# Patient Record
Sex: Female | Born: 1945 | Race: White | Hispanic: No | Smoking: Never smoker
Health system: Southern US, Community
[De-identification: ages and names within clinical notes are randomized; demographics above are authoritative.]

## PROBLEM LIST (undated history)

## (undated) DIAGNOSIS — Z86010 Personal history of colonic polyps: Secondary | ICD-10-CM

## (undated) HISTORY — PX: CHOLECYSTECTOMY: SHX55

## (undated) HISTORY — DX: Personal history of colonic polyps: Z86.010

---

## 2001-03-20 ENCOUNTER — Other Ambulatory Visit: Admission: RE | Admit: 2001-03-20 | Discharge: 2001-03-20 | Payer: Self-pay | Admitting: Obstetrics and Gynecology

## 2002-07-24 ENCOUNTER — Other Ambulatory Visit: Admission: RE | Admit: 2002-07-24 | Discharge: 2002-07-24 | Payer: Self-pay | Admitting: Obstetrics and Gynecology

## 2003-12-29 ENCOUNTER — Other Ambulatory Visit: Admission: RE | Admit: 2003-12-29 | Discharge: 2003-12-29 | Payer: Self-pay | Admitting: Obstetrics and Gynecology

## 2004-07-13 ENCOUNTER — Ambulatory Visit: Payer: Self-pay | Admitting: Internal Medicine

## 2005-01-16 ENCOUNTER — Ambulatory Visit: Payer: Self-pay | Admitting: Internal Medicine

## 2005-04-12 ENCOUNTER — Ambulatory Visit: Payer: Self-pay | Admitting: Internal Medicine

## 2005-08-22 ENCOUNTER — Ambulatory Visit: Payer: Self-pay | Admitting: Internal Medicine

## 2008-06-19 ENCOUNTER — Ambulatory Visit: Payer: Self-pay | Admitting: Internal Medicine

## 2008-07-03 ENCOUNTER — Ambulatory Visit: Payer: Self-pay | Admitting: Internal Medicine

## 2008-07-16 ENCOUNTER — Ambulatory Visit: Payer: Self-pay | Admitting: Cardiology

## 2008-11-03 ENCOUNTER — Ambulatory Visit (HOSPITAL_COMMUNITY): Admission: RE | Admit: 2008-11-03 | Discharge: 2008-11-03 | Payer: Self-pay | Admitting: Obstetrics and Gynecology

## 2008-11-03 ENCOUNTER — Encounter (INDEPENDENT_AMBULATORY_CARE_PROVIDER_SITE_OTHER): Payer: Self-pay | Admitting: Obstetrics and Gynecology

## 2009-05-07 ENCOUNTER — Encounter (INDEPENDENT_AMBULATORY_CARE_PROVIDER_SITE_OTHER): Payer: Self-pay | Admitting: *Deleted

## 2009-07-13 ENCOUNTER — Encounter: Payer: Self-pay | Admitting: Cardiology

## 2009-08-10 DIAGNOSIS — E785 Hyperlipidemia, unspecified: Secondary | ICD-10-CM

## 2009-08-10 DIAGNOSIS — R87619 Unspecified abnormal cytological findings in specimens from cervix uteri: Secondary | ICD-10-CM

## 2009-08-10 DIAGNOSIS — Z87898 Personal history of other specified conditions: Secondary | ICD-10-CM

## 2009-08-10 DIAGNOSIS — Z8719 Personal history of other diseases of the digestive system: Secondary | ICD-10-CM

## 2009-08-23 ENCOUNTER — Ambulatory Visit: Payer: Self-pay | Admitting: Cardiology

## 2011-01-10 LAB — COMPREHENSIVE METABOLIC PANEL
AST: 30 U/L (ref 0–37)
Albumin: 4.1 g/dL (ref 3.5–5.2)
Alkaline Phosphatase: 82 U/L (ref 39–117)
Chloride: 103 mEq/L (ref 96–112)
GFR calc Af Amer: >60 mL/min (ref >60)
Potassium: 4.2 mEq/L (ref 3.5–5.1)
Total Bilirubin: 0.6 mg/dL (ref 0.3–1.2)

## 2011-01-10 LAB — CBC
Platelets: 305 10*3/uL (ref 150–400)
WBC: 5.8 10*3/uL (ref 4.0–10.5)

## 2011-02-07 NOTE — H&P (Signed)
NAMEDAMEKA, Shelly White NO.:  [id number]   MEDICAL RECORD NO.:  [id number]         PATIENT TYPE:  WAMB   LOCATION:                                FACILITY:  [facility]   PHYSICIAN:  Guy Sandifer. Henderson Cloud, M.D. DATE OF BIRTH:  1946-05-10   DATE OF ADMISSION:  11/03/2008  DATE OF DISCHARGE:                              HISTORY & PHYSICAL   CHIEF COMPLAINT:  Endometrial masses.   HISTORY OF PRESENT ILLNESS:  This patient is a 65 year old divorced  white female, G1, P1, who has a family history of ovarian cancer.  For  that reason, a CA-125 was obtained which was normal.  An ultrasound was  also done.  That ultrasound revealed the uterus measuring 7.0 x 2.9 x  4.6 cm.  There was a suggestion of an endometrial density.  Subsequent  sonohysterogram was consistent with an 11- and a 4-mm polypoid type  masses within the endometrial cavity.  Hysteroscopy with resectoscope,  dilatation and curettage has been reviewed.  All questions have been  answered.   PAST MEDICAL HISTORY:  1. History of abnormal Pap smear.  2. History of headache.  3. History of gallbladder disease.   PAST SURGICAL HISTORY:  1. Tonsillectomy.  2. Bartholin cyst drainage.  3. Cholecystectomy.  4. Carpal tunnel surgery.   MEDICATIONS:  None.   No known drug allergies.   SOCIAL HISTORY:  Denies tobacco, alcohol, or drug abuse.   FAMILY HISTORY:  Positive for peptic ulcer disease, anemia, gallbladder  disease, osteoporosis, diverticulosis, arthritis, diabetes, and cancer.   REVIEW OF SYSTEMS:  NEUROLOGIC:  Denies headache.  CARDIAC:  Denies  chest pain.  PULMONARY:  Denies shortness of breath.   PHYSICAL EXAMINATION:  VITAL SIGNS:  Height 5 feet 3 inches, weight 163  pounds, blood pressure 130/86.  LUNGS:  Clear to auscultation.  HEART:  Regular rate and rhythm.  ABDOMEN:  Soft, nontender without masses.  PELVIC:  Vulva, vagina, cervix without lesion.  Uterus normal size,  mobile, nontender.  Adnexa  nontender without masses.  EXTREMITIES:  Grossly within normal limits.  NEUROLOGIC:  Grossly within normal limits.   ASSESSMENT:  Endometrial masses.   PLAN:  Hysteroscopy with resectoscope, dilatation and curettage.      Guy Sandifer Henderson Cloud, M.D.  Electronically Signed     JET/MEDQ  D:  10/28/2008  T:  10/29/2008  Job:  [id number]

## 2011-02-07 NOTE — Assessment & Plan Note (Signed)
[facility]                            CARDIOLOGY OFFICE NOTE   SHANDRIA, CLINCH                       MRN:          [id number]  DATE:07/16/2008                            DOB:          12/17/1945    I was asked by Dr. Harold Hedge to consult on Shelly White with a  mixed hyperlipidemia.   HISTORY OF PRESENT ILLNESS:  She is 65 years of age divorced white  female who has had elevated cholesterol for sometime.  Her last values  had gone up to the highest level of 810 and were 250.  Total  cholesterol, HDL was 39, LDL 193, triglycerides 91, and total  cholesterol HDL ratio was 6.4.   She has no other conventional risk factors other than she is fairly  somewhat sedentary walking only sporadically and working in a yard and  she is over the age of 65.   She denies any symptoms of ischemia or anginal equivalents.   PAST MEDICAL HISTORY:  She has never smoked.  She does not drink  alcohol.  She has never used any recreational products.  She has been  mildly overweight for several years.   ALLERGIES:  She has had no dye allergies.  She has no known drug  allergies.   MEDICATIONS:  She is currently taking a multivitamin, vitamin D, vitamin  B12, and omega 3.   PAST SURGICAL HISTORY:  Otherwise, is significant for tonsillectomy in  1954, cholecystectomy in 1980, carpal tunnel in 1984 and 1987, and a  Bartholin cyst in 1971.   FAMILY HISTORY:  Negative for premature coronary artery disease.   SOCIAL HISTORY:  She is divorced.  She lives in [place].  She worked  in Marine scientist for 40 years.  She has 1 son.   REVIEW OF SYSTEMS:  Negative otherwise other than the HPI.   PHYSICAL EXAMINATION:  VITAL SIGNS:  Her blood pressure is 126/88, her  pulse is 83 and regular.  She is 5 feet 3 inches and weighs 154.  GENERAL:  She is very pleasant, but very anxious.  Alert and oriented  x3.  She is mildly overweight, in no acute distress.  HEENT:   Normal.  NECK:  Carotid upstrokes were equal bilateral without bruits.  No JVD.  Thyroid is not enlarged.  Trachea is midline.  LUNGS:  Clear to auscultation and percussion.  HEART:  Reveals a nondisplaced PMI.  Normal S1 and S2.  No murmur, rub,  or gallop.  ABDOMEN:  Soft, good bowel sounds.  No midline bruit.  EXTREMITIES:  Revealed no cyanosis, clubbing, or edema.  Pulses are  intact.  NEUROLOGIC:  Intact.   Electrocardiogram is normal.   ASSESSMENT AND PLAN:  I spent a long time talking to Ms. Bucholz about  her current risk of having a cardiovascular event in the next 10 years.  I would put it somewhere in the 5% range.   She really does not want to take the medication though she does need  some criteria particularly with her HDL and her LDL being so high for a  statin.  We talked about the benefits of walking or exercise 3 hours a week.  She  is noticed a 10-15% improvement in her lipids when she loses weight,  which I have also advised her to do.  At this point, we will postpone  pharmacological therapy.   PLAN:  1. Exercise 3 hours per week.  2. Try to lose 10% of body weight.  3. Continue omega-3 fish oil,  4. See him back in a year for accountability and followup.   She was advised if her LDL will continue to rise as she gets older  unless she substantially changes her diet and really loses weight.  At  that point, certainly a statin may become indicated as she gets older.     Thomas C. Daleen Squibb, MD, [facility]  Electronically Signed    TCW/MedQ  DD: 07/16/2008  DT: 07/16/2008  Job #: [id number]   cc:   Guy Sandifer. Henderson Cloud, M.D.

## 2011-02-07 NOTE — Op Note (Signed)
NAMEBOBBIE, Shelly White              ACCOUNT NO.:  [id number]   MEDICAL RECORD NO.:  [id number]          PATIENT TYPE:  AMB   LOCATION:  SDC                           FACILITY:  [facility]   PHYSICIAN:  Guy Sandifer. Henderson Cloud, M.D. DATE OF BIRTH:  02-16-1946   DATE OF PROCEDURE:  DATE OF DISCHARGE:                               OPERATIVE REPORT   PREOPERATIVE DIAGNOSIS:  Endometrial masses.   POSTOPERATIVE DIAGNOSIS:  Endometrial masses.   PROCEDURE:  Hysteroscopy with resection of endometrial masses,  dilatation curettage.   SURGEON:  Guy Sandifer. Henderson Cloud, M.D.   ANESTHESIA:  General with LMA.   SPECIMENS:  Endometrial resections with endometrial curettings, both to  pathology.   ESTIMATED BLOOD LOSS:  Drops, I's and O's of distending media 25 cc  deficit.   INDICATIONS AND CONSENT:  This patient is a 65 year old divorced white  female G1, P1 with a findings of endometrial masses.  Details are  dictated in history and physical.  Hysteroscopy, resectoscope,  dilatation and curettage has been discussed with the patient  preoperatively.  Potential risks and complications were discussed  preoperatively including not limited to infection, uterine perforation,  organ damage, bleeding requiring transfusion of blood products with HIV  and hepatitis acquisition, DVT, PE, pneumonia, recurrent polyps.  All  questions have been answered and consent is signed on the chart.   FINDINGS:  There are 2 polypoid-type structures both approximately 1 cm  arising from the upper posterior and upper anterior endometrial canals.  Fallopian tube __________ was identified bilaterally and no other  abnormal structures were noted.   PROCEDURE:  The patient is taken to the operating room where she is  identified, placed dorsal supine position, and general anesthesia was  induced via LMA.  She was then placed in dorsal lithotomy position where  she is prepped, bladder straight catheterized, and she is draped in  sterile fashion.  Bivalve speculum was placed in the vagina.  Anterior  cervical lip was injected with 1% Xylocaine and grasped with single-  tooth tenaculum.  Paracervical block was placed at 2, 4, 5, 7, 8, and 10  o'clock positions with approximately 20 cc of the same solution.  Cervix  was gently progressively dilated to 31 dilator.  The resectoscope with  single right-angle wire loop was placed.  The endocervical canal and  advanced under direct visualization using distending media.  The above  findings were noted.  The polypoid type structures are resected without  difficulty.  They are both removed and sent to Pathology.  Good  hemostasis was maintained.  Sharp curettage was done for scant  curettings.  Good hemostasis was noted.  All counts correct.  The  patient is awakened, taken to recovery room in stable condition.      Guy Sandifer Henderson Cloud, M.D.  Electronically Signed     JET/MEDQ  D:  11/03/2008  T:  11/03/2008  Job:  [id number]

## 2013-08-18 ENCOUNTER — Encounter: Payer: Self-pay | Admitting: Internal Medicine

## 2013-08-18 DIAGNOSIS — Z860100 Personal history of colon polyps, unspecified: Secondary | ICD-10-CM | POA: Insufficient documentation

## 2013-08-18 HISTORY — DX: Personal history of colonic polyps: Z86.010

## 2013-08-26 ENCOUNTER — Encounter: Payer: Self-pay | Admitting: Internal Medicine

## 2014-02-18 ENCOUNTER — Telehealth: Payer: Self-pay | Admitting: Internal Medicine

## 2014-02-18 NOTE — Telephone Encounter (Signed)
ok 

## 2014-02-18 NOTE — Telephone Encounter (Signed)
Pt would like to know if you will accept her as a pt. Pt says she saw you in the past, but it has been over 10 years. Pt does have medicare.

## 2014-02-23 ENCOUNTER — Encounter: Payer: Self-pay | Admitting: Internal Medicine

## 2014-02-23 ENCOUNTER — Ambulatory Visit (INDEPENDENT_AMBULATORY_CARE_PROVIDER_SITE_OTHER): Payer: Medicare Other | Admitting: Internal Medicine

## 2014-02-23 VITALS — BP 150/90 | HR 81 | Temp 98.4°F | Resp 20 | Ht 62.25 in | Wt 168.0 lb

## 2014-02-23 DIAGNOSIS — E010 Iodine-deficiency related diffuse (endemic) goiter: Secondary | ICD-10-CM

## 2014-02-23 DIAGNOSIS — Z86010 Personal history of colonic polyps: Secondary | ICD-10-CM

## 2014-02-23 DIAGNOSIS — E049 Nontoxic goiter, unspecified: Secondary | ICD-10-CM

## 2014-02-23 DIAGNOSIS — Z23 Encounter for immunization: Secondary | ICD-10-CM

## 2014-02-23 DIAGNOSIS — E785 Hyperlipidemia, unspecified: Secondary | ICD-10-CM

## 2014-02-23 NOTE — Progress Notes (Signed)
   Subjective:    Patient ID: Shelly White, female    DOB: 1946-01-07, 68 y.o.   MRN: [id number]  HPI  68 year old former patient who is seen today to reestablish.  She has history of mild dyslipidemia, as well as colonic polyps.  Her second colonoscopy was in 2009 and a 10 year interval was recommended. She takes no chronic prescription medications for a number of supplements and OTC antihistamines.  Past medical history:  Allergic rhinitis GERD Mild dyslipidemia History of colonic polyps  Surgical history:  Status post cholecystectomy Status post tonsillectomy  Family history father died age 29 Mother died 9 complications of essential thrombocytosis 2 brothers in good health.  One sister died of complications of ovarian cancer.  History of cardiomegaly  Social history nonsmoker Divorced One son and 2 grandchildren    Review of Systems  Constitutional: Negative for fever, appetite change, fatigue and unexpected weight change.  HENT: Negative for congestion, dental problem, ear pain, hearing loss, mouth sores, nosebleeds, sinus pressure, sore throat, tinnitus, trouble swallowing and voice change.   Eyes: Negative for photophobia, pain, redness and visual disturbance.  Respiratory: Negative for cough, chest tightness and shortness of breath.   Cardiovascular: Negative for chest pain, palpitations and leg swelling.  Gastrointestinal: Negative for nausea, vomiting, abdominal pain, diarrhea, constipation, blood in stool, abdominal distention and rectal pain.  Genitourinary: Negative for dysuria, urgency, frequency, hematuria, flank pain, vaginal bleeding, vaginal discharge, difficulty urinating, genital sores, vaginal pain, menstrual problem and pelvic pain.  Musculoskeletal: Negative for arthralgias, back pain and neck stiffness.  Skin: Negative for rash.  Neurological: Negative for dizziness, syncope, speech difficulty, weakness, light-headedness, numbness and headaches.    Hematological: Negative for adenopathy. Does not bruise/bleed easily.  Psychiatric/Behavioral: Negative for suicidal ideas, behavioral problems, self-injury, dysphoric mood and agitation. The patient is not nervous/anxious.        Objective:   Physical Exam  Constitutional: She is oriented to person, place, and time. She appears well-developed and well-nourished.  HENT:  Head: Normocephalic and atraumatic.  Right Ear: External ear normal.  Left Ear: External ear normal.  Mouth/Throat: Oropharynx is clear and moist.  Eyes: Conjunctivae and EOM are normal.  Neck: Normal range of motion. Neck supple. No JVD present. Thyromegaly present.  Enlarged nodular thyroid with a prominent left lobe  Cardiovascular: Normal rate, regular rhythm, normal heart sounds and intact distal pulses.   No murmur heard. Pulmonary/Chest: Effort normal and breath sounds normal. She has no wheezes. She has no rales.  Abdominal: Soft. Bowel sounds are normal. She exhibits no distension and no mass. There is no tenderness. There is no rebound and no guarding.  Musculoskeletal: Normal range of motion. She exhibits no edema and no tenderness.  Neurological: She is alert and oriented to person, place, and time. She has normal reflexes. No cranial nerve deficit. She exhibits normal muscle tone. Coordination normal.  Skin: Skin is warm and dry. No rash noted.  Psychiatric: She has a normal mood and affect. Her behavior is normal.          Assessment & Plan:   Mild dyslipidemia History of colonic polyps.  Followup colonoscopies 2019 Thyromegaly.  We'll check a thyroid ultrasound  Schedule CPX with updated lab

## 2014-02-23 NOTE — Addendum Note (Signed)
Addended by: Marian Sorrow on: 02/23/2014 05:39 PM   Modules accepted: Orders

## 2014-02-23 NOTE — Progress Notes (Signed)
Pre-visit discussion using our clinic review tool. No additional management support is needed unless otherwise documented below in the visit note.  

## 2014-02-23 NOTE — Patient Instructions (Signed)
Thyroid ultrasound as discussed  You need to lose weight.  Consider a lower calorie diet and regular exercise.    It is important that you exercise regularly, at least 20 minutes 3 to 4 times per week.  If you develop chest pain or shortness of breath seek  medical attention.  Limit your sodium (Salt) intake  Return in 6 months for follow-up

## 2014-03-02 ENCOUNTER — Ambulatory Visit
Admission: RE | Admit: 2014-03-02 | Discharge: 2014-03-02 | Disposition: A | Discharge status: Home / Self Care | Payer: Medicare Other | Source: Ambulatory Visit | Attending: Internal Medicine | Admitting: Internal Medicine

## 2014-03-02 ENCOUNTER — Other Ambulatory Visit: Payer: Self-pay | Admitting: Internal Medicine

## 2014-03-02 DIAGNOSIS — E010 Iodine-deficiency related diffuse (endemic) goiter: Secondary | ICD-10-CM

## 2014-03-02 DIAGNOSIS — E041 Nontoxic single thyroid nodule: Secondary | ICD-10-CM

## 2014-03-09 ENCOUNTER — Ambulatory Visit: Payer: Medicare Other | Admitting: Internal Medicine

## 2014-03-11 ENCOUNTER — Ambulatory Visit (INDEPENDENT_AMBULATORY_CARE_PROVIDER_SITE_OTHER): Payer: Medicare Other | Admitting: Endocrinology

## 2014-03-11 ENCOUNTER — Encounter: Payer: Self-pay | Admitting: Endocrinology

## 2014-03-11 VITALS — BP 128/76 | HR 84 | Temp 97.9°F | Resp 16 | Ht 62.25 in | Wt 163.0 lb

## 2014-03-11 DIAGNOSIS — E042 Nontoxic multinodular goiter: Secondary | ICD-10-CM | POA: Insufficient documentation

## 2014-03-11 NOTE — Progress Notes (Signed)
Patient ID: Shelly White, female   DOB: Apr 21, 1946, 68 y.o.   MRN: [id number]    Reason for Appointment: Thyroid nodule, new consultation  Referring physician:Kwiatkowski, Collier [place]     History of Present Illness:   The patient's thyroid nodule was first discovered in 6/15 She was seen by her PCP for general physical exam was found to have a goiter with more significant prominence on the left side She has not seen or felt a swelling in her neck recently Also has not been told to have a thyroid enlargement previously  She has had no difficulty with swallowing   Does not feel like she has any choking sensation in her neck or pressure in any position or when lying down.  No results found for this basename: freeT4,  tsh   She has had an ultrasound exam in 6/14 which showed the following  Multiple small areas of nodularity are present throughout the right lobe.  The only discretely marginated nodule is a predominantly solid and partially cystic nodule measuring approximately 1.6 x 1.0 x 1.1 cm. This nodule contains no calcifications. The second largest nodule in the right lobe is a cyst measuring 1.2 cm and having a benign appearance.  Left thyroid lobe: Marland Kitchen Dominant solid nodule in the mid to inferior aspect of the left lobe measures approximately 4.2 x 2.9 x 2.5 cm.       Medication List       This list is accurate as of: 03/11/14  9:34 PM.  Always use your most recent med list.               ADVIL 200 MG tablet  Generic drug:  ibuprofen  Take 200-400 mg by mouth every 6 (six) hours as needed.     ALAVERT ALLERGY/SINUS PO  Take 1 tablet by mouth as needed.     co-enzyme Q-10 30 MG capsule  Take 30 mg by mouth daily.     FIBER FORMULA Caps  Take 1-2 capsules by mouth 3 (three) times daily with meals as needed.     multivitamin tablet  Take 1 tablet by mouth daily.     OMEGA 3 PO  Take 1 tablet by mouth daily.     OVER THE COUNTER MEDICATION  Take 1 capsule by mouth 3  (three) times daily with meals as needed. Cholester Care     Resveratrol 100 MG Caps  Take 1 capsule by mouth daily.        Allergies: No Known Allergies  Past Medical History  Diagnosis Date  . Personal history of colonic adenoma 08/18/2013    Past Surgical History  Procedure Laterality Date  . Cholecystectomy      Family History  Problem Relation Age of Onset  . Thyroid disease Paternal Aunt     Social History:  reports that she has never smoked. She has never used smokeless tobacco. She reports that she does not drink alcohol or use illicit drugs.   Review of Systems:  There is no history of high blood pressure.             No history of Diabetes.      History of hyperlipidemia, taking only fish oil  She is seen regularly by her gynecologist         Examination:   BP 128/76  Pulse 84  Temp(Src) 97.9 F (36.6 C)  Resp 16  Ht 5' 2.25" (1.581 m)  Wt 163 lb (73.936 kg)  BMI 29.58 kg/m2  SpO2 96%  LMP 02/24/2003   General Appearance:  well-looking, mild generalized obesity present. She is somewhat anxious        Eyes: No abnormal prominence or eyelid swelling.          THYROID: Thyroid nodule felt on the left side especially on swallowing and measures about 2 cm.. Nodular right lobe also felt about twice normal Pemberton sign negative. No stridor present There is no lymphadenopathy .  Abdomen shows no hepatosplenomegaly Heart sounds normal   Reflexes  at biceps are slightly brisk. No tremor present  No pedal edema  Assessment/Plan:  Thyroid nodule: She has a dominant thyroid nodule on the left side as part of a multinodular goiter Discussed with the patient that since this is relatively larger and solid will need to evaluate further with a needle aspiration Discussed the technique of needle aspiration and submitted results Also explained to her that 95% of thyroid nodules are benign and she would need surgery only if she has malignant aspirate on the  needle aspiration or indeterminate cytology However we also need to check her thyroid functions to make sure she is not having any subclinical hyperthyroidism in which case a scan would be done first Brochure on thyroid nodules given and patient had no further questions   [facility] 03/11/2014

## 2014-03-12 LAB — TSH: TSH: 1.07 u[IU]/mL (ref 0.35–4.50)

## 2014-03-12 LAB — T4, FREE: Free T4: 0.73 ng/dL (ref 0.60–1.60)

## 2014-03-12 NOTE — Progress Notes (Signed)
Quick Note:  Please let patient know that the lab result is normal ______ 

## 2014-03-13 ENCOUNTER — Telehealth: Payer: Self-pay | Admitting: Endocrinology

## 2014-03-13 NOTE — Telephone Encounter (Signed)
Pt calling regarding message about results she could not understand the message in full pelase call back

## 2014-03-13 NOTE — Telephone Encounter (Signed)
I spoke with the patient and gave her detailed information on her lab results, she understood

## 2014-03-17 ENCOUNTER — Other Ambulatory Visit (HOSPITAL_COMMUNITY)
Admission: RE | Admit: 2014-03-17 | Discharge: 2014-03-17 | Disposition: A | Discharge status: Home / Self Care | Payer: Medicare Other | Source: Ambulatory Visit | Attending: Interventional Radiology | Admitting: Interventional Radiology

## 2014-03-17 ENCOUNTER — Ambulatory Visit
Admission: RE | Admit: 2014-03-17 | Discharge: 2014-03-17 | Disposition: A | Discharge status: Home / Self Care | Payer: Medicare Other | Source: Ambulatory Visit | Attending: Endocrinology | Admitting: Endocrinology

## 2014-03-17 DIAGNOSIS — E042 Nontoxic multinodular goiter: Secondary | ICD-10-CM | POA: Insufficient documentation

## 2014-03-20 NOTE — Progress Notes (Signed)
Quick Note:  Please let patient know that the biopsy showed benign cells, will see her in followup in 6 months ______

## 2015-02-15 ENCOUNTER — Telehealth: Payer: Self-pay

## 2015-02-15 NOTE — Telephone Encounter (Signed)
Called and spoke with pt about mammogram.  Pt states she usually gets her mammogram from Dr. Gaetano Net and she is going to call and set that up.  Advised pt to have a copy sent to Dr. Raliegh Ip.

## 2015-03-18 ENCOUNTER — Encounter: Payer: Self-pay | Admitting: Internal Medicine

## 2016-02-29 IMAGING — US US THYROID BIOPSY
1 series · 8 of 8 positions shown · non-contrast
Comparison: None.

CLINICAL DATA: Dominant left thyroid nodule

EXAM:
ULTRASOUND GUIDED NEEDLE ASPIRATE BIOPSY OF THE THYROID GLAND

[Series 1: us thyroid biopsy · 0.08mm/px · 8 acquisitions, 8 frames shown]
[im 1/8]
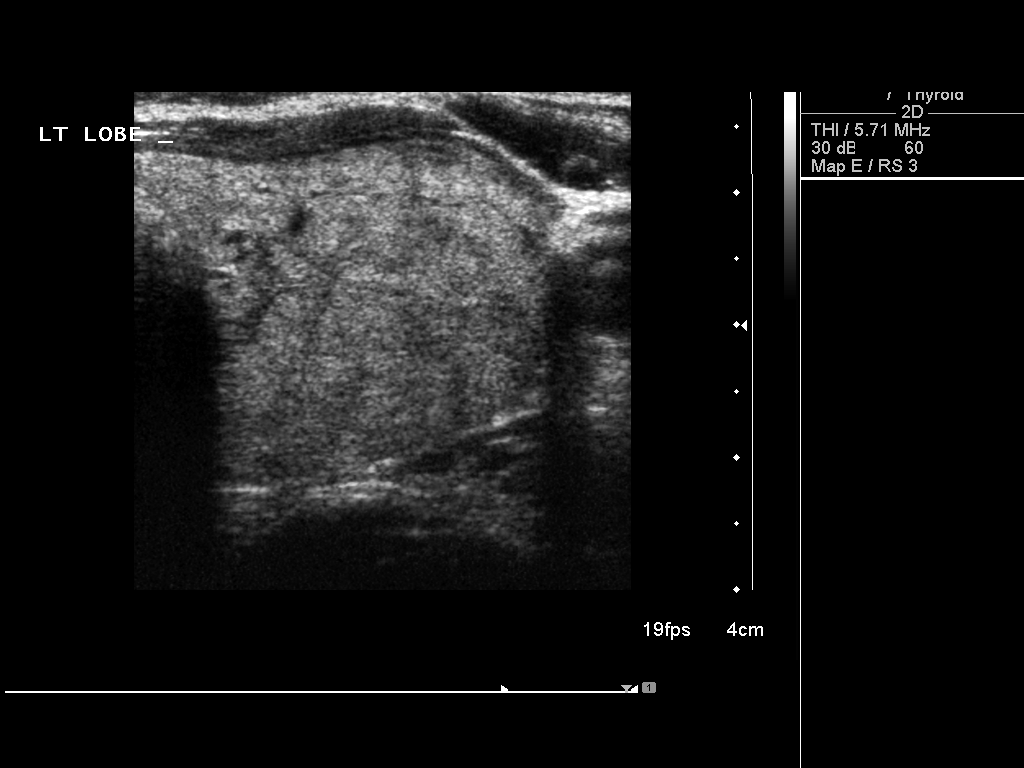
[im 2/8]
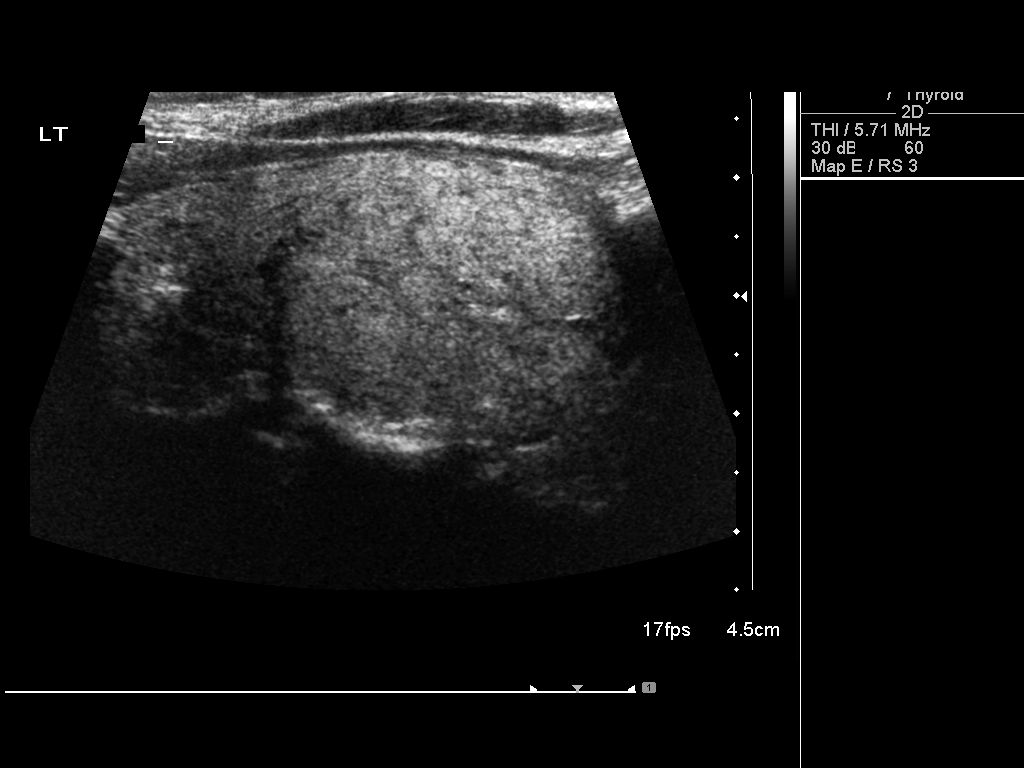
[im 3/8]
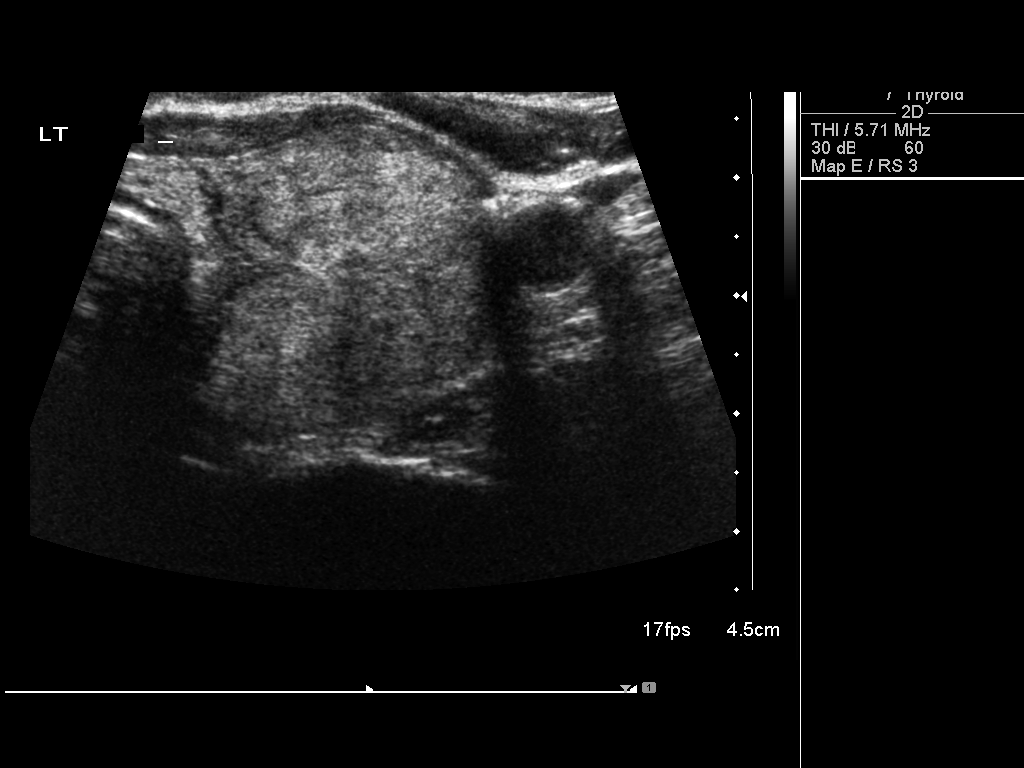
[im 4/8]
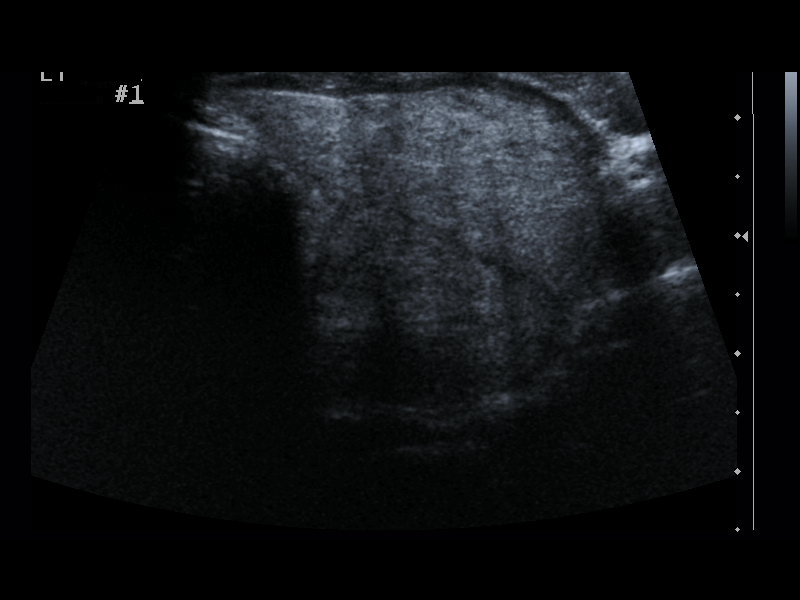
[im 5/8]
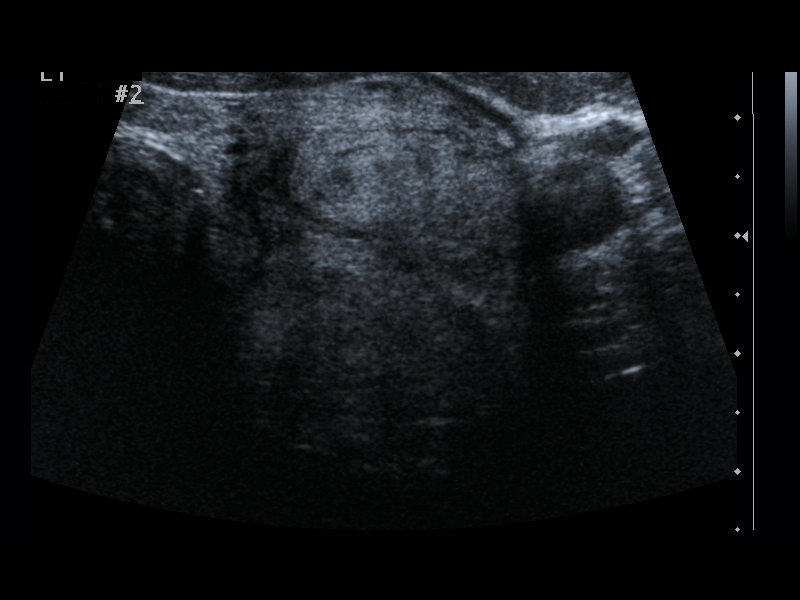
[im 6/8]
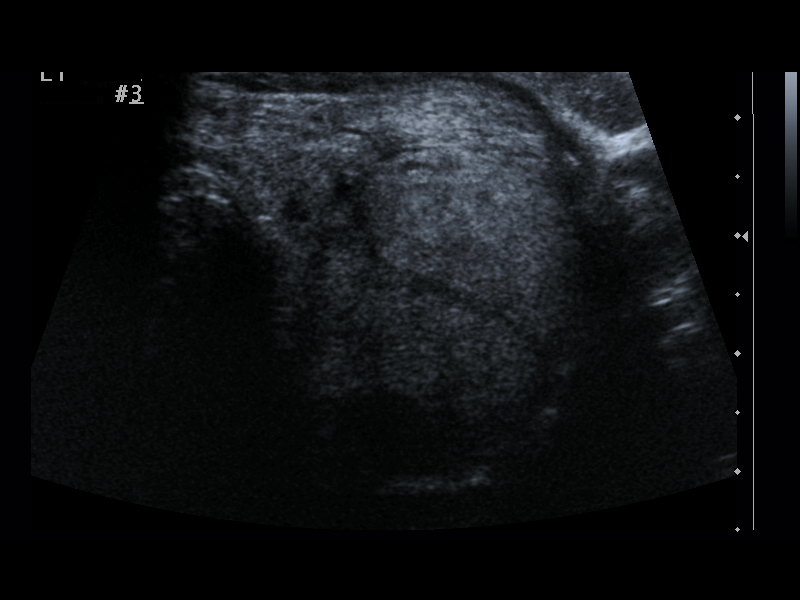
[im 7/8]
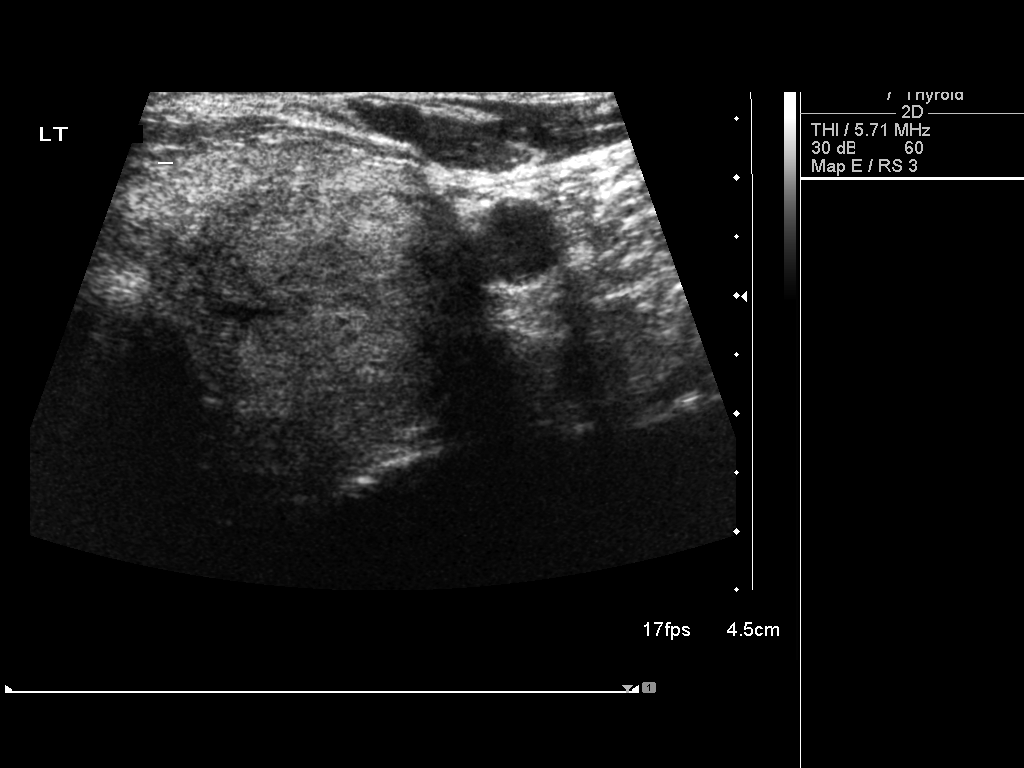
[im 8/8]
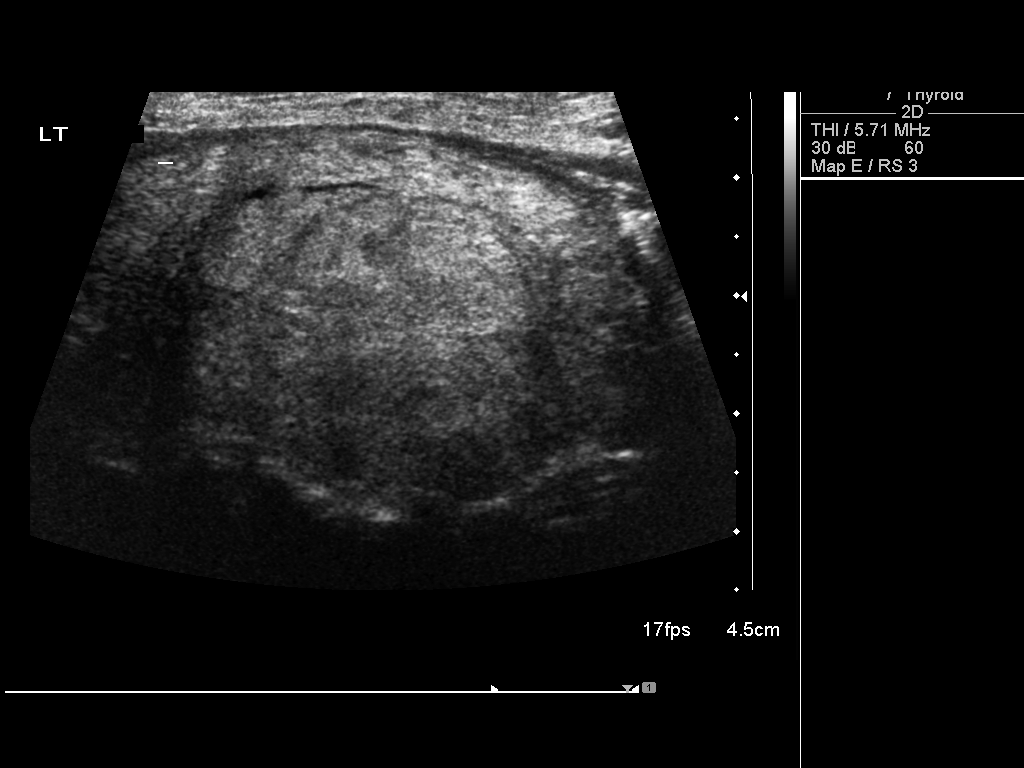

[8 of 8 positions shown; findings below may reference images not displayed]

PROCEDURE:
Thyroid biopsy was thoroughly discussed with the patient and
questions were answered. The benefits, risks, alternatives, and
complications were also discussed. The patient understands and
wishes to proceed with the procedure. Written consent was obtained.

Ultrasound was performed to localize and mark an adequate site for
the biopsy. The patient was then prepped and draped in a normal
sterile fashion. Local anesthesia was provided with 1% lidocaine.
Using direct ultrasound guidance, 3 passes were made using needles
into the nodule within the left lobe of the thyroid. Ultrasound was
used to confirm needle placements on all occasions. Specimens were
sent to Pathology for analysis.

Complications:  None.
FINDINGS: Images document needle placement in the dominant left thyroid
nodule.
IMPRESSION: Ultrasound guided needle aspirate biopsy performed of the left
thyroid nodule.

## 2016-07-03 LAB — HM MAMMOGRAPHY

## 2016-07-03 LAB — HM PAP SMEAR: HM PAP: NEGATIVE

## 2016-07-03 LAB — HM DEXA SCAN: HM DEXA SCAN: NORMAL

## 2016-08-08 ENCOUNTER — Encounter: Payer: Self-pay | Admitting: Internal Medicine

## 2017-02-20 ENCOUNTER — Encounter: Payer: Self-pay | Admitting: Internal Medicine

## 2017-02-20 ENCOUNTER — Ambulatory Visit (INDEPENDENT_AMBULATORY_CARE_PROVIDER_SITE_OTHER): Payer: Medicare HMO | Admitting: Internal Medicine

## 2017-02-20 VITALS — BP 148/82 | HR 78 | Temp 98.6°F | Ht 62.5 in | Wt 168.0 lb

## 2017-02-20 DIAGNOSIS — B079 Viral wart, unspecified: Secondary | ICD-10-CM | POA: Diagnosis not present

## 2017-02-20 DIAGNOSIS — Z86010 Personal history of colonic polyps: Secondary | ICD-10-CM | POA: Diagnosis not present

## 2017-02-20 DIAGNOSIS — E042 Nontoxic multinodular goiter: Secondary | ICD-10-CM

## 2017-02-20 NOTE — Progress Notes (Signed)
Subjective:    Patient ID: Shelly White, female    DOB: Sep 15, 1946, 71 y.o.   MRN: [id number]  HPI  71 year old patient who has not been seen in some time.  She presents with a chief complaint of a wart involving her left index finger.  She has been treated with cryotherapy by dermatology on a prior occasion She has a history of multinodular goiter.  She did have the OB/GYN evaluation in October of last year.  She has a history of mild dyslipidemia and also a history colonic polyps. Her last colonoscopy was 2009.  She had a prior adenomatous polyp in 2004 but a 10 year interval was recommended She has a history of vitamin D deficiency  Past Medical History:  Diagnosis Date  . Personal history of colonic adenoma 08/18/2013     Social History   Social History  . Marital status: Divorced    Spouse name: N/A  . Number of children: N/A  . Years of education: N/A   Occupational History  . Not on file.   Social History Main Topics  . Smoking status: Never Smoker  . Smokeless tobacco: Never Used  . Alcohol use No  . Drug use: No  . Sexual activity: Not on file   Other Topics Concern  . Not on file   Social History Narrative  . No narrative on file    Past Surgical History:  Procedure Laterality Date  . CHOLECYSTECTOMY      Family History  Problem Relation Age of Onset  . Thyroid disease Paternal Aunt     No Known Allergies  Current Outpatient Prescriptions on File Prior to Visit  Medication Sig Dispense Refill  . FIBER FORMULA CAPS Take 1-2 capsules by mouth 3 (three) times daily with meals as needed.    Marland Kitchen ibuprofen (ADVIL) 200 MG tablet Take 200-400 mg by mouth every 6 (six) hours as needed.    . Loratadine-Pseudoephedrine (ALAVERT ALLERGY/SINUS PO) Take 1 tablet by mouth as needed.    . Multiple Vitamin (MULTIVITAMIN) tablet Take 1 tablet by mouth daily.    . Omega-3 Fatty Acids (OMEGA 3 PO) Take 1 tablet by mouth daily.    Marland Kitchen OVER THE COUNTER MEDICATION Take  1 capsule by mouth 3 (three) times daily with meals as needed. Cholester Care    . Resveratrol 100 MG CAPS Take 1 capsule by mouth daily.     No current facility-administered medications on file prior to visit.     BP (!) 148/82 (BP Location: Left Arm, Patient Position: Sitting, Cuff Size: Normal)   Pulse 78   Temp 98.6 F (37 C) (Oral)   Ht 5' 2.5" (1.588 m)   Wt 168 lb (76.2 kg)   LMP 02/24/2003   SpO2 99%   BMI 30.24 kg/m     Review of Systems  Constitutional: Negative.   HENT: Negative for congestion, dental problem, hearing loss, rhinorrhea, sinus pressure, sore throat and tinnitus.   Eyes: Negative for pain, discharge and visual disturbance.  Respiratory: Negative for cough and shortness of breath.   Cardiovascular: Negative for chest pain, palpitations and leg swelling.  Gastrointestinal: Negative for abdominal distention, abdominal pain, blood in stool, constipation, diarrhea, nausea and vomiting.  Genitourinary: Negative for difficulty urinating, dysuria, flank pain, frequency, hematuria, pelvic pain, urgency, vaginal bleeding, vaginal discharge and vaginal pain.  Musculoskeletal: Negative for arthralgias, gait problem and joint swelling.  Skin: Negative for rash.       Wart, left index finger  Neurological: Negative for dizziness, syncope, speech difficulty, weakness, numbness and headaches.  Hematological: Negative for adenopathy.  Psychiatric/Behavioral: Negative for agitation, behavioral problems and dysphoric mood. The patient is not nervous/anxious.        Objective:   Physical Exam  Constitutional: She is oriented to person, place, and time. She appears well-developed and well-nourished.  HENT:  Head: Normocephalic.  Right Ear: External ear normal.  Left Ear: External ear normal.  Mouth/Throat: Oropharynx is clear and moist.  Eyes: Conjunctivae and EOM are normal. Pupils are equal, round, and reactive to light.  Neck: Normal range of motion. Neck supple.  Thyromegaly present.  Cardiovascular: Normal rate, regular rhythm, normal heart sounds and intact distal pulses.   Pulmonary/Chest: Effort normal and breath sounds normal.  Abdominal: Soft. Bowel sounds are normal. She exhibits no mass. There is no tenderness.  Musculoskeletal: Normal range of motion.  Lymphadenopathy:    She has no cervical adenopathy.  Neurological: She is alert and oriented to person, place, and time.  Skin: Skin is warm and dry. No rash noted.  6 mm typical wart involving the dorsal aspect of the left proximal index finger  Psychiatric: She has a normal mood and affect. Her behavior is normal.          Assessment & Plan:   Wart, left index finger.  Treated with cryotherapy Multinodular goiter History dyslipidemia  Patient will return at her convenience later in the year for an annual exam Colonoscopy 2019  Shelly White

## 2017-02-20 NOTE — Patient Instructions (Addendum)
WE NOW OFFER   [facility] FAST TRACK!!!  SAME DAY Appointments for ACUTE CARE  Such as: Sprains, Injuries, cuts, abrasions, rashes, muscle pain, joint pain, back pain Colds, flu, sore throats, headache, allergies, cough, fever  Ear pain, sinus and eye infections Abdominal pain, nausea, vomiting, diarrhea, upset stomach Animal/insect bites  3 Easy Ways to Schedule: Walk-In Scheduling Call in scheduling Mychart Sign-up: [email or web address]   Return in 4 months for your annual exam      Cryotherapy WHAT IS CRYOTHERAPY? Cryotherapy, or cold therapy, is a treatment that uses cold temperatures to treat an injury or medical condition. It includes using cold packs or ice packs to reduce pain and swelling. WHO SHOULD NOT USE CRYOTHERAPY? Cryotherapy is not safe for people who cannot tell you if they are in pain, such as small children and people who have dementia. Cryotherapy is also not safe for people with certain conditions, such as:  Raynaud phenomenon.  Cold hypersensitivity.  Numbness or loss of feeling in the area being iced. Cryotherapy may or may not be safe for people with certain other conditions. Do not use cryotherapy without your health care provider's approval if you have:  A heart condition.  High blood pressure.  Open or healing wounds.  An infection.  Rheumatoid arthritis.  Poor circulation.  Diabetes.  Certain skin conditions. HOW DO I USE CRYOTHERAPY? To use cryotherapy at home to reduce pain and swelling:  Place a towel between the cold source and your skin.  Apply the cold source for no more than 20 minutes at a time.  Check your skin after 5 minutes to make sure there are no signs of a poor response to cold or skin damage. Check for:  White spots on your skin. Your skin may look blotchy or mottled.  Skin that looks blue or pale.  Skin that feels waxy or hard.  Repeat these steps as many times each day as told by  your health care provider. HOW CAN I MAKE A COLD PACK? When using a cold pack at home to reduce pain and swelling, you can use:  A silica gel cold pack that has been left in the freezer. You can buy this online or in stores.  A plastic bag of frozen vegetables.  A sealable plastic bag that has been filled with crushed ice. Always wrap the pack in a dry or damp towel to avoid direct contact with your skin. WHEN SHOULD I CALL MY HEALTH CARE PROVIDER? Call your health care provider if:  You develop white spots on your skin. This may give your skin a blotchy or mottled look.  Your skin turns blue or pale.  Your skin becomes waxy or hard.  Your swelling gets worse. This information is not intended to replace advice given to you by your health care provider. Make sure you discuss any questions you have with your health care provider. Document Released: 05/08/2011 Document Revised: 02/17/2016 Document Reviewed: 05/26/2015 Elsevier Interactive Patient Education  2017 [employer].

## 2017-06-11 ENCOUNTER — Ambulatory Visit (INDEPENDENT_AMBULATORY_CARE_PROVIDER_SITE_OTHER): Payer: Medicare HMO | Admitting: Internal Medicine

## 2017-06-11 ENCOUNTER — Encounter: Payer: Self-pay | Admitting: Internal Medicine

## 2017-06-11 VITALS — BP 148/70 | HR 83 | Temp 98.3°F | Ht 62.5 in | Wt 167.2 lb

## 2017-06-11 DIAGNOSIS — B084 Enteroviral vesicular stomatitis with exanthem: Secondary | ICD-10-CM | POA: Diagnosis not present

## 2017-06-11 NOTE — Progress Notes (Signed)
Subjective:    Patient ID: Shelly White, female    DOB: [birth year], 71 y.o.   MRN: [id number]  HPI  71 year old patient who presents with a three-day history of rash involving her hands and feet.  One week ago.  She had a sore throat that has large resolved.  He complains of some weakness.  There is been no fever.  The rash involving the hands and feet is described as mildly painful and improving.  No pertinent exposure history  Past Medical History:  Diagnosis Date  . Personal history of colonic adenoma 08/18/2013     Social History   Social History  . Marital status: Divorced    Spouse name: N/A  . Number of children: N/A  . Years of education: N/A   Occupational History  . Not on file.   Social History Main Topics  . Smoking status: Never Smoker  . Smokeless tobacco: Never Used  . Alcohol use No  . Drug use: No  . Sexual activity: Not on file   Other Topics Concern  . Not on file   Social History Narrative  . No narrative on file    Past Surgical History:  Procedure Laterality Date  . CHOLECYSTECTOMY      Family History  Problem Relation Age of Onset  . Thyroid disease Paternal Aunt     No Known Allergies  Current Outpatient Prescriptions on File Prior to Visit  Medication Sig Dispense Refill  . Coenzyme Q10 200 MG capsule Take 200 mg by mouth daily.    Marland Kitchen FIBER FORMULA CAPS Take 1-2 capsules by mouth 3 (three) times daily with meals as needed.    Marland Kitchen ibuprofen (ADVIL) 200 MG tablet Take 200-400 mg by mouth every 6 (six) hours as needed.    . Loratadine-Pseudoephedrine (ALAVERT ALLERGY/SINUS PO) Take 1 tablet by mouth as needed.    . Multiple Vitamin (MULTIVITAMIN) tablet Take 1 tablet by mouth daily.    . Omega-3 Fatty Acids (OMEGA 3 PO) Take 1 tablet by mouth daily.    Marland Kitchen OVER THE COUNTER MEDICATION Take 1 capsule by mouth 3 (three) times daily with meals as needed. Cholester Care    . Resveratrol 100 MG CAPS Take 1 capsule by mouth daily.     No  current facility-administered medications on file prior to visit.     BP (!) 148/70 (BP Location: Left Arm, Patient Position: Sitting, Cuff Size: Normal)   Pulse 83   Temp 98.3 F (36.8 C) (Oral)   Ht 5' 2.5" (1.588 m)   Wt 167 lb 3.2 oz (75.8 kg)   LMP 02/24/2003   SpO2 96%   BMI 30.09 kg/m     Review of Systems  Constitutional: Positive for fatigue.  HENT: Positive for sore throat. Negative for congestion, dental problem, hearing loss, rhinorrhea, sinus pressure and tinnitus.   Eyes: Negative for pain, discharge and visual disturbance.  Respiratory: Negative for cough and shortness of breath.   Cardiovascular: Negative for chest pain, palpitations and leg swelling.  Gastrointestinal: Negative for abdominal distention, abdominal pain, blood in stool, constipation, diarrhea, nausea and vomiting.  Genitourinary: Negative for difficulty urinating, dysuria, flank pain, frequency, hematuria, pelvic pain, urgency, vaginal bleeding, vaginal discharge and vaginal pain.  Musculoskeletal: Negative for arthralgias, gait problem and joint swelling.  Skin: Positive for rash.  Neurological: Negative for dizziness, syncope, speech difficulty, weakness, numbness and headaches.  Hematological: Negative for adenopathy.  Psychiatric/Behavioral: Negative for agitation, behavioral problems and dysphoric mood. The patient is  not nervous/anxious.        Objective:   Physical Exam  Constitutional: She is oriented to person, place, and time. She appears well-developed and well-nourished.  HENT:  Head: Normocephalic.  Right Ear: External ear normal.  Left Ear: External ear normal.  Mouth/Throat: Oropharynx is clear and moist.  Oropharynx was clear  Eyes: Pupils are equal, round, and reactive to light. Conjunctivae and EOM are normal.  Neck: Normal range of motion. Neck supple. No thyromegaly present.  Cardiovascular: Normal rate, regular rhythm, normal heart sounds and intact distal pulses.     Pulmonary/Chest: Effort normal and breath sounds normal.  Abdominal: Soft. Bowel sounds are normal. She exhibits no mass. There is no tenderness.  Musculoskeletal: Normal range of motion.  Lymphadenopathy:    She has no cervical adenopathy.  Neurological: She is alert and oriented to person, place, and time.  Skin: Skin is warm and dry. No rash noted.  Scattered erythematous papular rash involving the hands and feet, most marked on the plantar surface of the feet and palmar side of the hands.  A few scattered blisters noted  Psychiatric: She has a normal mood and affect. Her behavior is normal.          Assessment & Plan:    Probable hand-foot-and-mouth disease.  Patient reassured.  Patient information Supplied Will treat symptomatically with Tylenol or Advil  [name]

## 2017-06-11 NOTE — Patient Instructions (Addendum)
Hand, Foot, and Mouth Disease, Adult Hand, foot, and mouth disease is a common viral illness. It happens mainly in children who are younger than 70 years old, but adolescents and adults may also get it. The illness often causes:  Sore throat.  Sores in the mouth.  Fever.  Rash on the hands and feet.  Usually, this condition is not serious. Most people get better within 1-2 weeks. What are the causes? This condition is usually caused by a group of viruses called enteroviruses. The disease can spread from person to person (is contagious). A person is most contagious during the first week of the illness. The infection spreads through direct contact with:  Nose discharge of an infected person.  Throat discharge of an infected person.  Stool (feces) of an infected person.  What are the signs or symptoms? Symptoms of this condition include:  Small sores in the mouth. These may cause pain.  A rash on the hands and feet, and sometimes on the buttocks. The rash may also occur on the arms, legs, or other areas of the body. The rash may look like small red bumps or sores and may have blisters.  Fever.  Body aches or headaches.  Irritability.  Decreased appetite.  How is this diagnosed? This condition can usually be diagnosed with a physical exam in which your health care provider will look at your rash and mouth sores. Tests are usually not needed. In some cases, a stool (feces) sample or a throat swab may be taken to check for the virus or for other infections. How is this treated? In most cases, no treatment is needed. People usually get better within 2 weeks without treatment. To help relieve pain or fever, your health care provider may recommend over-the-counter medicines such as ibuprofen or acetaminophen. To help relieve discomfort from mouth sores, your health care provider may recommend using:  Solutions that are rinsed in the mouth.  Pain-relieving gel that is applied to the  sores (topical gel).  Antacid medicine.  Follow these instructions at home: Managing pain and discomfort  Rinse your mouth with a salt-water mixture 3-4 times a day or as needed. To make a salt-water mixture, completely dissolve -1 tsp of salt in 1 cup of warm water. This can help to reduce pain from the mouth sores. Your health care provider may also recommend other rinse solutions to treat mouth sores.  To relieve discomfort when you are eating: ? Try combinations of foods to see what you can tolerate. Aim for a balanced diet. ? Eat soft foods. These may be easier to swallow. ? Avoid foods and drinks that are salty, spicy, or acidic. ? Avoid alcohol. ? Try cold food and drinks, such as water, milk, milkshakes, frozen ice pops, slushies, and sherbets. Low-calorie sport drinks are good choices for staying hydrated. General instructions  Return to your normal activities as told by your health care provider. Ask your health care provider what activities are safe for you.  Take or apply over-the-counter and prescription medicines only as told by your health care provider.  Wash your hands often with soap and water. If soap and water are not available, use hand sanitizer.  Stay away from work, schools, or other group settings during the first few days of the illness, or until your fever is gone.  Keep all follow-up visits as told by your health care provider. This is important. Contact a health care provider if:  Your symptoms get worse or do not   improve within 2 weeks.  You have pain that does not get better with medicine.  You feel very irritable.  You have trouble swallowing.  You develop sores or blisters on your lips or outside of your mouth.  You have a fever for more than 3 days. Get help right away if:  You develop signs of severe dehydration, such as: ? Decreased urination. This means urinating only very small amounts or urinating fewer than 3 times in a 24-hour  period. ? Urine that is very dark. ? Dry mouth, tongue, or lips. ? Decreased tears or sunken eyes. ? Dry skin. ? Rapid breathing. ? Decreased activity or being very sleepy. ? Pale skin. ? Fingertips taking longer than 2 seconds to turn pink after a gentle squeeze. ? Weight loss.  You have a severe headache.  You have a stiff neck.  You experience changes in your behavior.  You have chest pain.  You have trouble breathing. Summary  Hand, foot, and mouth disease is a common viral illness.  This disease can spread from person to person (is contagious).  The illness often causes a sore throat, sores in the mouth, fever, and a rash on the hands and feet.  Typically, no treatment is needed for this condition. People usually get better within 2 weeks without treatment.  Get help right away if you develop signs of severe dehydration.

## 2018-10-14 ENCOUNTER — Ambulatory Visit (INDEPENDENT_AMBULATORY_CARE_PROVIDER_SITE_OTHER): Payer: Medicare HMO | Admitting: Family Medicine

## 2018-10-14 ENCOUNTER — Ambulatory Visit (HOSPITAL_BASED_OUTPATIENT_CLINIC_OR_DEPARTMENT_OTHER)
Admission: RE | Admit: 2018-10-14 | Discharge: 2018-10-14 | Disposition: A | Discharge status: Home / Self Care | Payer: Medicare HMO | Source: Ambulatory Visit | Attending: Family Medicine | Admitting: Family Medicine

## 2018-10-14 ENCOUNTER — Encounter: Payer: Self-pay | Admitting: Family Medicine

## 2018-10-14 VITALS — BP 104/68 | HR 88 | Temp 98.3°F | Ht 62.0 in | Wt 168.0 lb

## 2018-10-14 DIAGNOSIS — S52352A Displaced comminuted fracture of shaft of radius, left arm, initial encounter for closed fracture: Secondary | ICD-10-CM | POA: Diagnosis not present

## 2018-10-14 DIAGNOSIS — M25532 Pain in left wrist: Secondary | ICD-10-CM

## 2018-10-14 DIAGNOSIS — S52502A Unspecified fracture of the lower end of left radius, initial encounter for closed fracture: Secondary | ICD-10-CM

## 2018-10-14 NOTE — Progress Notes (Signed)
Pre visit review using our clinic review tool, if applicable. No additional management support is needed unless otherwise documented below in the visit note. 

## 2018-10-14 NOTE — Patient Instructions (Addendum)
Ice/cold pack over area for 10-15 min twice daily.  OK to take Tylenol 1000 mg (2 extra strength tabs) or 975 mg (3 regular strength tabs) every 6 hours as needed.  If you do not hear anything about your referral in the next several days, call our office and ask for an update.  Let us know if you need anything.

## 2018-10-14 NOTE — Progress Notes (Signed)
Musculoskeletal Exam  Patient: Shelly White DOB: Jul 19, 1946  DOS: 10/14/2018  SUBJECTIVE:  Chief Complaint:   Chief Complaint  Patient presents with  . Fall    left hand pain and swelling    Shelly White is a 73 y.o.  female for evaluation and treatment of L hand pain.   Onset:  1 day ago. Fell. Location: L hand Character: sharp if she moves it a certain way Progression of issue:  has slightly improved Associated symptoms: bruising, swelling, decreased ROM Treatment: to date has been rest and ice.   Neurovascular symptoms: no  ROS: Musculoskeletal/Extremities: +wrist pain  Past Medical History:  Diagnosis Date  . Personal history of colonic adenoma 08/18/2013    Objective: VITAL SIGNS: BP 104/68 (BP Location: Left Arm, Patient Position: Sitting, Cuff Size: Normal)   Pulse 88   Temp 98.3 F (36.8 C) (Oral)   Ht 5\' 2"  (1.575 m)   Wt 168 lb (76.2 kg)   LMP 02/24/2003   SpO2 96%   BMI 30.73 kg/m  Constitutional: Well formed, well developed. No acute distress. Cardiovascular: Brisk cap refill Thorax & Lungs: No accessory muscle use Musculoskeletal: L wrist.   Normal active range of motion: no.   Normal passive range of motion: no Tenderness to palpation: yes over volar distal radius and volar distal ulna Deformity: pronounced edema Ecchymosis: yes Neurologic: Normal sensory function.  Psychiatric: Normal mood. Age appropriate judgment and insight. Alert & oriented x 3.    Assessment:  Closed fracture of distal end of left radius, unspecified fracture morphology, initial encounter - Plan: Ambulatory referral to Orthopedic Surgery  Left wrist pain - Plan: DG Wrist Complete Left  Plan: XR unofficially shows distal radius fx, refer ortho, place in splint. Ice, Tylenol, NSAIDs, wrist brace.  F/u prn. The patient voiced understanding and agreement to the plan.   [place], DO 10/14/18  2:39 PM

## 2018-10-16 ENCOUNTER — Ambulatory Visit (INDEPENDENT_AMBULATORY_CARE_PROVIDER_SITE_OTHER): Payer: Medicare HMO | Admitting: Orthopaedic Surgery

## 2018-10-16 DIAGNOSIS — S52502A Unspecified fracture of the lower end of left radius, initial encounter for closed fracture: Secondary | ICD-10-CM

## 2018-10-16 NOTE — Progress Notes (Signed)
   Office Visit Note   Patient: Shelly White           Date of Birth: 1946-02-11           MRN: [id number] Visit Date: 10/16/2018              Requested by: Shelda Pal, [address], [place] [address] PCP: Shelda Pal, DO   Assessment & Plan: Visit Diagnoses:  1. Closed fracture of distal end of left radius, unspecified fracture morphology, initial encounter     Plan: Impression is left distal radius fracture.  We will place the patient in a short arm cast.  She will elevate for swelling.  Continue with over-the-counter medications as needed for pain.  Follow-up with [place] in 1 week's time for repeat evaluation three-view x-rays of the left wrist.  Follow-Up Instructions: Return in about 1 week (around 10/23/2018).   Orders:  No orders of the defined types were placed in this encounter.  No orders of the defined types were placed in this encounter.     Procedures: No procedures performed   Clinical Data: No additional findings.   Subjective: Chief Complaint  Patient presents with  . Left Wrist - Fracture    HPI patient is a pleasant 73 year old right-hand-dominant female who presents to our clinic today following an injury to her left wrist.  She was mopping this past Sunday, 10/13/2018, when she slipped falling on an outstretched left hand into her mop bucket.  She was seen by her PCP where x-rays were obtained.  X-rays demonstrated a distal radius fracture.  She was placed in a removable splint.  She comes in today for further evaluation and treatment recommendation.  She is having pain to the entire left wrist.  She has moderate swelling as well.  She has not been elevating for swelling.  She has been alternating with ice and heat.  She has been taking Advil with great relief of symptoms.  She denies any radicular symptoms.  Review of Systems as detailed in HPI.  All others reviewed and are negative.   Objective: Vital  Signs: LMP 02/24/2003   Physical Exam well-developed well-nourished female in no acute distress.  Alert and oriented x3.  Ortho Exam examination of the left wrist reveals marked swelling into the hand.  Marked tenderness over the distal radius.  Limited range of motion secondary to pain.  She is neurovascularly intact distally.  Specialty Comments:  No specialty comments available.  Imaging: No new imaging   PMFS History: Patient Active Problem List   Diagnosis Date Noted  . Closed fracture of left distal radius 10/14/2018  . Multinodular goiter 03/11/2014  . Personal history of colonic adenoma 08/18/2013  . HYPERLIPIDEMIA-MIXED 08/10/2009  . PAP SMEAR, ABNORMAL 08/10/2009  . GALLBLADDER DISEASE, HX OF 08/10/2009  . HEADACHES, HX OF 08/10/2009   Past Medical History:  Diagnosis Date  . Personal history of colonic adenoma 08/18/2013    Family History  Problem Relation Age of Onset  . Thyroid disease Paternal Aunt     Past Surgical History:  Procedure Laterality Date  . CHOLECYSTECTOMY     Social History   Occupational History  . Not on file  Tobacco Use  . Smoking status: Never Smoker  . Smokeless tobacco: Never Used  Substance and Sexual Activity  . Alcohol use: No  . Drug use: No  . Sexual activity: Not on file

## 2018-10-17 ENCOUNTER — Encounter: Payer: Self-pay | Admitting: Internal Medicine

## 2018-10-23 ENCOUNTER — Ambulatory Visit (INDEPENDENT_AMBULATORY_CARE_PROVIDER_SITE_OTHER): Payer: Medicare HMO | Admitting: Orthopaedic Surgery

## 2018-10-23 ENCOUNTER — Encounter (INDEPENDENT_AMBULATORY_CARE_PROVIDER_SITE_OTHER): Payer: Self-pay | Admitting: Orthopaedic Surgery

## 2018-10-23 ENCOUNTER — Ambulatory Visit (INDEPENDENT_AMBULATORY_CARE_PROVIDER_SITE_OTHER): Payer: Medicare HMO

## 2018-10-23 DIAGNOSIS — S52532D Colles' fracture of left radius, subsequent encounter for closed fracture with routine healing: Secondary | ICD-10-CM

## 2018-10-23 NOTE — Progress Notes (Signed)
   Office Visit Note   Patient: Shelly White           Date of Birth: [birth year]           MRN: [id number] Visit Date: 10/23/2018              Requested by: Shelda Pal, [place], [place] [address] PCP: Shelda Pal, DO   Assessment & Plan: Visit Diagnoses:  1. Closed Colles' fracture of left radius with routine healing, subsequent encounter     Plan: Impression is stable alignment of distal radius fracture.  We will continue the short arm cast for another 2 weeks.  Reevaluation at that time with 2 view x-rays of the left wrist out of the cast.  If x-rays demonstrate healing we will begin hand therapy at that time.  Follow-Up Instructions: Return in about 2 weeks (around 11/06/2018).   Orders:  Orders Placed This Encounter  Procedures  . XR Wrist Complete Left   No orders of the defined types were placed in this encounter.     Procedures: No procedures performed   Clinical Data: No additional findings.   Subjective: Chief Complaint  Patient presents with  . Left Wrist - Pain    Shelly White is two-week status post nondisplaced left distal radius fracture.  She is doing better.  Swelling has improved.   Review of Systems   Objective: Vital Signs: LMP 02/24/2003   Physical Exam  Ortho Exam Left wrist exam shows a well fitting short arm cast.  Mild swelling of the fingers.  No neurovascular compromise. Specialty Comments:  No specialty comments available.  Imaging: Xr Wrist Complete Left  Result Date: 10/23/2018 Stable alignment of distal radius fracture.  No interval worsening or changes.    PMFS History: Patient Active Problem List   Diagnosis Date Noted  . Closed fracture of left distal radius 10/14/2018  . Multinodular goiter 03/11/2014  . Personal history of colonic adenoma 08/18/2013  . HYPERLIPIDEMIA-MIXED 08/10/2009  . PAP SMEAR, ABNORMAL 08/10/2009  . GALLBLADDER DISEASE, HX OF 08/10/2009    . HEADACHES, HX OF 08/10/2009   Past Medical History:  Diagnosis Date  . Personal history of colonic adenoma 08/18/2013    Family History  Problem Relation Age of Onset  . Thyroid disease Paternal Aunt     Past Surgical History:  Procedure Laterality Date  . CHOLECYSTECTOMY     Social History   Occupational History  . Not on file  Tobacco Use  . Smoking status: Never Smoker  . Smokeless tobacco: Never Used  Substance and Sexual Activity  . Alcohol use: No  . Drug use: No  . Sexual activity: Not on file

## 2018-11-07 ENCOUNTER — Ambulatory Visit (INDEPENDENT_AMBULATORY_CARE_PROVIDER_SITE_OTHER): Payer: Medicare HMO

## 2018-11-07 ENCOUNTER — Encounter (INDEPENDENT_AMBULATORY_CARE_PROVIDER_SITE_OTHER): Payer: Self-pay | Admitting: Orthopaedic Surgery

## 2018-11-07 ENCOUNTER — Ambulatory Visit (INDEPENDENT_AMBULATORY_CARE_PROVIDER_SITE_OTHER): Payer: Medicare HMO | Admitting: Orthopaedic Surgery

## 2018-11-07 DIAGNOSIS — S52532D Colles' fracture of left radius, subsequent encounter for closed fracture with routine healing: Secondary | ICD-10-CM

## 2018-11-07 NOTE — Progress Notes (Signed)
   Office Visit Note   Patient: Shelly White           Date of Birth: 11-Jan-1946           MRN: [id number] Visit Date: 11/07/2018              Requested by: Shelda Pal, [address] [place], [place] [address] PCP: Shelda Pal, DO   Assessment & Plan: Visit Diagnoses:  1. Closed Colles' fracture of left radius with routine healing, subsequent encounter     Plan: At this point she has demonstrated enough healing to start OT for hand and wrist rehab.  Referral was made today.  We discontinue the cast and placed in a removable wrist brace.  We will see her back in 6 weeks with two-view x-rays of the left wrist.  Follow-Up Instructions: Return in about 6 weeks (around 12/19/2018).   Orders:  Orders Placed This Encounter  Procedures  . XR Wrist 2 Views Left   No orders of the defined types were placed in this encounter.     Procedures: No procedures performed   Clinical Data: No additional findings.   Subjective: Chief Complaint  Patient presents with  . Left Wrist - Pain    [name] is 4 weeks status post nondisplaced distal radius fracture.  She is overall doing much better.  She has very little pain.  She has no real complaints today.   Review of Systems   Objective: Vital Signs: LMP 02/24/2003   Physical Exam  Ortho Exam Left wrist exam shows mild to moderate swelling.  No neurovascular compromise.  Range of motion is adequate for this time. Specialty Comments:  No specialty comments available.  Imaging: Xr Wrist 2 Views Left  Result Date: 11/07/2018 Stable alignment of distal radius fracture with evidence of progressive healing bony consolidation.    PMFS History: Patient Active Problem List   Diagnosis Date Noted  . Closed fracture of left distal radius 10/14/2018  . Multinodular goiter 03/11/2014  . Personal history of colonic adenoma 08/18/2013  . HYPERLIPIDEMIA-MIXED 08/10/2009  . PAP SMEAR, ABNORMAL  08/10/2009  . GALLBLADDER DISEASE, HX OF 08/10/2009  . HEADACHES, HX OF 08/10/2009   Past Medical History:  Diagnosis Date  . Personal history of colonic adenoma 08/18/2013    Family History  Problem Relation Age of Onset  . Thyroid disease Paternal Aunt     Past Surgical History:  Procedure Laterality Date  . CHOLECYSTECTOMY     Social History   Occupational History  . Not on file  Tobacco Use  . Smoking status: Never Smoker  . Smokeless tobacco: Never Used  Substance and Sexual Activity  . Alcohol use: No  . Drug use: No  . Sexual activity: Not on file

## 2018-11-20 ENCOUNTER — Telehealth (INDEPENDENT_AMBULATORY_CARE_PROVIDER_SITE_OTHER): Payer: Self-pay | Admitting: Orthopaedic Surgery

## 2018-11-20 DIAGNOSIS — S52532D Colles' fracture of left radius, subsequent encounter for closed fracture with routine healing: Secondary | ICD-10-CM

## 2018-11-20 NOTE — Telephone Encounter (Signed)
See message.

## 2018-11-20 NOTE — Telephone Encounter (Signed)
Wrist rehab s/p wrist fracture

## 2018-11-20 NOTE — Telephone Encounter (Signed)
[facility] [place] [facility]

## 2018-11-20 NOTE — Telephone Encounter (Signed)
What do you want to order for her for OT? Anything specific?

## 2018-11-20 NOTE — Telephone Encounter (Signed)
Pt called saying she was told to go to PT in [place] but they don't have a OT. So pt needs a note for physical therapy and for it to be approved through her insurance .

## 2018-11-21 NOTE — Addendum Note (Signed)
Addended by: Anselm Pancoast on: 11/21/2018 08:47 AM   Modules accepted: Orders

## 2018-11-21 NOTE — Telephone Encounter (Signed)
Patient called back stating that she is needing to speak with someone about her PT/OT.  She also stated that Dr. Erlinda Hong filled out the wrong form to be approved through her insurance.  Please advise pt.  Thank you.  CB#[phone].  She will be leaving her house around 11:00 a.m.

## 2018-11-21 NOTE — Telephone Encounter (Signed)
I talked to patient again and [facility] [place] [facility] is too far away.  I will reach out to [facility] PT there in [place].  Referral amended.

## 2018-11-21 NOTE — Telephone Encounter (Signed)
IC patient and LM that I will call [facility] PT in [place], ph [phone], at [address]  Need to schedule her for PT.

## 2018-11-22 NOTE — Telephone Encounter (Signed)
Correction, PT ph [phone].  IC LMVM for them to call me to discuss auth/scheduling.

## 2018-11-25 NOTE — Telephone Encounter (Signed)
Referral faxed

## 2018-11-25 NOTE — Telephone Encounter (Signed)
Referral sent to [place] at [place], at the [facility], ph [phone], fax [phone].

## 2018-12-09 DIAGNOSIS — M25642 Stiffness of left hand, not elsewhere classified: Secondary | ICD-10-CM | POA: Diagnosis not present

## 2018-12-09 DIAGNOSIS — S52532D Colles' fracture of left radius, subsequent encounter for closed fracture with routine healing: Secondary | ICD-10-CM | POA: Diagnosis not present

## 2018-12-09 DIAGNOSIS — R29898 Other symptoms and signs involving the musculoskeletal system: Secondary | ICD-10-CM | POA: Diagnosis not present

## 2018-12-19 ENCOUNTER — Ambulatory Visit (INDEPENDENT_AMBULATORY_CARE_PROVIDER_SITE_OTHER): Payer: Medicare HMO | Admitting: Orthopaedic Surgery

## 2019-01-21 ENCOUNTER — Telehealth: Payer: Self-pay | Admitting: *Deleted

## 2019-01-21 NOTE — Telephone Encounter (Signed)
Have you seen this report?

## 2019-01-21 NOTE — Telephone Encounter (Signed)
Called number to request order to be refaxed as PCP has not seen.

## 2019-01-21 NOTE — Telephone Encounter (Signed)
Copied from [place] [id number]. Topic: General - Inquiry >> Jan 20, 2019  5:01 PM Sheran Luz wrote: Reason for CRM: Deb with [facility] calling to follow up on a fax regarding patient's bone density scan that was sent to office on 4/17. Deb confirmed that she has the correct fax. If office never received, she is requesting a call back. Please advise.    Deb call back # [phone]

## 2019-01-21 NOTE — Telephone Encounter (Signed)
Unless it is in file now, I have not seen a bone density report from anyone in the last 10 days. Ty.

## 2019-02-07 NOTE — Telephone Encounter (Signed)
Called left detailed message do not see where we have received those results/not in media. Left message of both front/back fax numbers to refax to my atten//Dr. Nani Ravens

## 2019-02-07 NOTE — Telephone Encounter (Signed)
April checking in on status of fax.

## 2019-02-10 ENCOUNTER — Telehealth: Payer: Self-pay | Admitting: Family Medicine

## 2019-02-10 NOTE — Telephone Encounter (Signed)
PCP received paperwork from [facility] saying she is overdue for her bone Density. PCP to call the patient recommend Bone Density. I called the patient and she was already well aware of [facility] Medicare and this information.  They have been calling her and she did inform them that she takes care of that at her regular GYN yearly appt and once she can get out of the house (due to the virus) she will take care of this.  But she was very frustrated as they will not stop calling her. Have documented she declines PCP doing///will get done at her GYN. She was also very upset they had contacted her PCP regarding this.

## 2020-09-16 DIAGNOSIS — R059 Cough, unspecified: Secondary | ICD-10-CM | POA: Diagnosis not present

## 2020-09-16 DIAGNOSIS — Z20822 Contact with and (suspected) exposure to covid-19: Secondary | ICD-10-CM | POA: Diagnosis not present

## 2020-09-16 DIAGNOSIS — R0981 Nasal congestion: Secondary | ICD-10-CM | POA: Diagnosis not present

## 2020-09-16 DIAGNOSIS — J029 Acute pharyngitis, unspecified: Secondary | ICD-10-CM | POA: Diagnosis not present

## 2021-11-30 ENCOUNTER — Ambulatory Visit: Payer: Self-pay | Admitting: *Deleted

## 2021-11-30 DIAGNOSIS — I5022 Chronic systolic (congestive) heart failure: Secondary | ICD-10-CM | POA: Diagnosis not present

## 2021-11-30 DIAGNOSIS — R531 Weakness: Secondary | ICD-10-CM | POA: Diagnosis not present

## 2021-11-30 DIAGNOSIS — Z7901 Long term (current) use of anticoagulants: Secondary | ICD-10-CM | POA: Diagnosis not present

## 2021-11-30 DIAGNOSIS — I639 Cerebral infarction, unspecified: Secondary | ICD-10-CM | POA: Diagnosis not present

## 2021-11-30 DIAGNOSIS — I6523 Occlusion and stenosis of bilateral carotid arteries: Secondary | ICD-10-CM | POA: Diagnosis not present

## 2021-11-30 DIAGNOSIS — G8321 Monoplegia of upper limb affecting right dominant side: Secondary | ICD-10-CM | POA: Diagnosis not present

## 2021-11-30 DIAGNOSIS — R7303 Prediabetes: Secondary | ICD-10-CM | POA: Diagnosis not present

## 2021-11-30 DIAGNOSIS — R29703 NIHSS score 3: Secondary | ICD-10-CM | POA: Diagnosis not present

## 2021-11-30 DIAGNOSIS — Q2112 Patent foramen ovale: Secondary | ICD-10-CM | POA: Diagnosis not present

## 2021-11-30 DIAGNOSIS — E785 Hyperlipidemia, unspecified: Secondary | ICD-10-CM | POA: Diagnosis not present

## 2021-11-30 DIAGNOSIS — R270 Ataxia, unspecified: Secondary | ICD-10-CM | POA: Diagnosis not present

## 2021-11-30 DIAGNOSIS — I517 Cardiomegaly: Secondary | ICD-10-CM | POA: Diagnosis not present

## 2021-11-30 DIAGNOSIS — I5020 Unspecified systolic (congestive) heart failure: Secondary | ICD-10-CM | POA: Diagnosis not present

## 2021-11-30 DIAGNOSIS — I10 Essential (primary) hypertension: Secondary | ICD-10-CM | POA: Diagnosis not present

## 2021-11-30 DIAGNOSIS — I493 Ventricular premature depolarization: Secondary | ICD-10-CM | POA: Diagnosis not present

## 2021-11-30 DIAGNOSIS — I340 Nonrheumatic mitral (valve) insufficiency: Secondary | ICD-10-CM | POA: Diagnosis not present

## 2021-11-30 DIAGNOSIS — I110 Hypertensive heart disease with heart failure: Secondary | ICD-10-CM | POA: Diagnosis not present

## 2021-11-30 NOTE — Telephone Encounter (Signed)
?  Chief Complaint: right hand numb ?Symptoms: this AM no strength in right hand ?Frequency: yesterday ?Pertinent Negatives: Patient denies  ?Disposition: '[x]'$ ED /'[]'$ Urgent Care (no appt availability in office) / '[]'$ Appointment(In office/virtual)/ '[]'$  [place] [facility]/ '[]'$ Home Care/ '[]'$ Refused Recommended Disposition /'[]'$  Mobile Bus/ '[]'$  Follow-up with PCP ?Additional Notes: Unable to speak to patient, son calling. Advised ED, advised to alert PCP as well. States will follow disposition.   ? ? ? ? ?Reason for Disposition ? [1] Numbness (i.e., loss of sensation) of the face, arm / hand, or leg / foot on one side of the body AND [2] sudden onset AND [3] brief (now gone) ? ?Answer Assessment - Initial Assessment Questions ?1. SYMPTOM: "What is the main symptom you are concerned about?" (e.g., weakness, numbness) ?    Right hand numb ?2. ONSET: "When did this start?" (minutes, hours, days; while sleeping) ?    Yesterday at lunch ?3. LAST NORMAL: "When was the last time you (the patient) were normal (no symptoms)?" ?    Noon yesterday ?4. PATTERN "Does this come and go, or has it been constant since it started?"  "Is it present now?" ?    *No Answer* ?5. CARDIAC SYMPTOMS: "Have you had any of the following symptoms: chest pain, difficulty breathing, palpitations?" ?    *No Answer* ?6. NEUROLOGIC SYMPTOMS: "Have you had any of the following symptoms: headache, dizziness, vision loss, double vision, changes in speech, unsteady on your feet?" ?    *No Answer* ?7. OTHER SYMPTOMS: "Do you have any other symptoms?" ?    none ?8. PREGNANCY: "Is there any chance you are pregnant?" "When was your last menstrual period?" ?    *No Answer* ? ?Protocols used: Neurologic Deficit-A-AH ? ?

## 2021-12-01 DIAGNOSIS — I639 Cerebral infarction, unspecified: Secondary | ICD-10-CM | POA: Diagnosis not present

## 2021-12-01 DIAGNOSIS — I340 Nonrheumatic mitral (valve) insufficiency: Secondary | ICD-10-CM | POA: Diagnosis not present

## 2021-12-01 DIAGNOSIS — I517 Cardiomegaly: Secondary | ICD-10-CM | POA: Diagnosis not present

## 2021-12-04 DIAGNOSIS — I69398 Other sequelae of cerebral infarction: Secondary | ICD-10-CM | POA: Diagnosis not present

## 2021-12-04 DIAGNOSIS — Z8673 Personal history of transient ischemic attack (TIA), and cerebral infarction without residual deficits: Secondary | ICD-10-CM | POA: Diagnosis not present

## 2021-12-04 DIAGNOSIS — I517 Cardiomegaly: Secondary | ICD-10-CM | POA: Diagnosis not present

## 2021-12-04 DIAGNOSIS — I999 Unspecified disorder of circulatory system: Secondary | ICD-10-CM | POA: Diagnosis not present

## 2021-12-04 DIAGNOSIS — Z79899 Other long term (current) drug therapy: Secondary | ICD-10-CM | POA: Diagnosis not present

## 2021-12-04 DIAGNOSIS — R202 Paresthesia of skin: Secondary | ICD-10-CM | POA: Diagnosis not present

## 2021-12-04 DIAGNOSIS — I639 Cerebral infarction, unspecified: Secondary | ICD-10-CM | POA: Diagnosis not present

## 2021-12-04 DIAGNOSIS — R2990 Unspecified symptoms and signs involving the nervous system: Secondary | ICD-10-CM | POA: Diagnosis not present

## 2021-12-04 DIAGNOSIS — I493 Ventricular premature depolarization: Secondary | ICD-10-CM | POA: Diagnosis not present

## 2021-12-04 DIAGNOSIS — R7303 Prediabetes: Secondary | ICD-10-CM | POA: Diagnosis not present

## 2021-12-04 DIAGNOSIS — E785 Hyperlipidemia, unspecified: Secondary | ICD-10-CM | POA: Diagnosis not present

## 2021-12-04 DIAGNOSIS — R29818 Other symptoms and signs involving the nervous system: Secondary | ICD-10-CM | POA: Diagnosis not present

## 2021-12-05 DIAGNOSIS — Z8673 Personal history of transient ischemic attack (TIA), and cerebral infarction without residual deficits: Secondary | ICD-10-CM | POA: Diagnosis not present

## 2021-12-05 DIAGNOSIS — R2990 Unspecified symptoms and signs involving the nervous system: Secondary | ICD-10-CM | POA: Diagnosis not present

## 2021-12-05 DIAGNOSIS — I639 Cerebral infarction, unspecified: Secondary | ICD-10-CM | POA: Diagnosis not present

## 2021-12-05 DIAGNOSIS — I493 Ventricular premature depolarization: Secondary | ICD-10-CM | POA: Diagnosis not present

## 2021-12-05 DIAGNOSIS — E785 Hyperlipidemia, unspecified: Secondary | ICD-10-CM | POA: Diagnosis not present

## 2021-12-05 DIAGNOSIS — R7303 Prediabetes: Secondary | ICD-10-CM | POA: Diagnosis not present

## 2021-12-13 DIAGNOSIS — R7303 Prediabetes: Secondary | ICD-10-CM | POA: Diagnosis not present

## 2021-12-13 DIAGNOSIS — I639 Cerebral infarction, unspecified: Secondary | ICD-10-CM | POA: Diagnosis not present

## 2021-12-13 DIAGNOSIS — I110 Hypertensive heart disease with heart failure: Secondary | ICD-10-CM | POA: Diagnosis not present

## 2021-12-13 DIAGNOSIS — I5022 Chronic systolic (congestive) heart failure: Secondary | ICD-10-CM | POA: Diagnosis not present

## 2021-12-13 DIAGNOSIS — E7849 Other hyperlipidemia: Secondary | ICD-10-CM | POA: Diagnosis not present

## 2021-12-21 DIAGNOSIS — R29898 Other symptoms and signs involving the musculoskeletal system: Secondary | ICD-10-CM | POA: Diagnosis not present

## 2021-12-21 DIAGNOSIS — Z789 Other specified health status: Secondary | ICD-10-CM | POA: Diagnosis not present

## 2021-12-21 DIAGNOSIS — R2990 Unspecified symptoms and signs involving the nervous system: Secondary | ICD-10-CM | POA: Diagnosis not present

## 2021-12-21 DIAGNOSIS — Z7409 Other reduced mobility: Secondary | ICD-10-CM | POA: Diagnosis not present

## 2021-12-21 DIAGNOSIS — I639 Cerebral infarction, unspecified: Secondary | ICD-10-CM | POA: Diagnosis not present

## 2021-12-22 DIAGNOSIS — I5189 Other ill-defined heart diseases: Secondary | ICD-10-CM | POA: Diagnosis not present

## 2022-01-02 DIAGNOSIS — I639 Cerebral infarction, unspecified: Secondary | ICD-10-CM | POA: Diagnosis not present

## 2022-01-02 DIAGNOSIS — I5189 Other ill-defined heart diseases: Secondary | ICD-10-CM | POA: Diagnosis not present

## 2022-01-02 DIAGNOSIS — I5022 Chronic systolic (congestive) heart failure: Secondary | ICD-10-CM | POA: Diagnosis not present

## 2022-01-23 DIAGNOSIS — I639 Cerebral infarction, unspecified: Secondary | ICD-10-CM | POA: Diagnosis not present

## 2022-01-25 DIAGNOSIS — I10 Essential (primary) hypertension: Secondary | ICD-10-CM | POA: Diagnosis not present

## 2022-01-25 DIAGNOSIS — I3481 Nonrheumatic mitral (valve) annulus calcification: Secondary | ICD-10-CM | POA: Diagnosis not present

## 2022-01-25 DIAGNOSIS — Z8673 Personal history of transient ischemic attack (TIA), and cerebral infarction without residual deficits: Secondary | ICD-10-CM | POA: Diagnosis not present

## 2022-01-25 DIAGNOSIS — D4989 Neoplasm of unspecified behavior of other specified sites: Secondary | ICD-10-CM | POA: Diagnosis not present

## 2022-01-25 DIAGNOSIS — Z7901 Long term (current) use of anticoagulants: Secondary | ICD-10-CM | POA: Diagnosis not present

## 2022-04-10 DIAGNOSIS — I639 Cerebral infarction, unspecified: Secondary | ICD-10-CM | POA: Diagnosis not present

## 2022-04-10 DIAGNOSIS — I5022 Chronic systolic (congestive) heart failure: Secondary | ICD-10-CM | POA: Diagnosis not present

## 2022-05-15 DIAGNOSIS — I639 Cerebral infarction, unspecified: Secondary | ICD-10-CM | POA: Diagnosis not present

## 2022-06-19 DIAGNOSIS — I371 Nonrheumatic pulmonary valve insufficiency: Secondary | ICD-10-CM | POA: Diagnosis not present

## 2022-06-19 DIAGNOSIS — I5189 Other ill-defined heart diseases: Secondary | ICD-10-CM | POA: Diagnosis not present

## 2022-06-19 DIAGNOSIS — I517 Cardiomegaly: Secondary | ICD-10-CM | POA: Diagnosis not present

## 2022-06-19 DIAGNOSIS — D151 Benign neoplasm of heart: Secondary | ICD-10-CM | POA: Diagnosis not present

## 2022-06-27 DIAGNOSIS — U071 COVID-19: Secondary | ICD-10-CM | POA: Diagnosis not present

## 2022-07-12 DIAGNOSIS — I5189 Other ill-defined heart diseases: Secondary | ICD-10-CM | POA: Diagnosis not present

## 2022-07-12 DIAGNOSIS — R7303 Prediabetes: Secondary | ICD-10-CM | POA: Diagnosis not present

## 2022-07-12 DIAGNOSIS — I5022 Chronic systolic (congestive) heart failure: Secondary | ICD-10-CM | POA: Diagnosis not present

## 2022-07-12 DIAGNOSIS — E7849 Other hyperlipidemia: Secondary | ICD-10-CM | POA: Diagnosis not present

## 2022-07-12 DIAGNOSIS — I639 Cerebral infarction, unspecified: Secondary | ICD-10-CM | POA: Diagnosis not present

## 2022-10-16 DIAGNOSIS — I252 Old myocardial infarction: Secondary | ICD-10-CM | POA: Diagnosis not present

## 2022-10-16 DIAGNOSIS — R9409 Abnormal results of other function studies of central nervous system: Secondary | ICD-10-CM | POA: Diagnosis not present

## 2022-10-16 DIAGNOSIS — I513 Intracardiac thrombosis, not elsewhere classified: Secondary | ICD-10-CM | POA: Diagnosis not present

## 2022-10-16 DIAGNOSIS — I501 Left ventricular failure: Secondary | ICD-10-CM | POA: Diagnosis not present

## 2022-10-16 DIAGNOSIS — I2510 Atherosclerotic heart disease of native coronary artery without angina pectoris: Secondary | ICD-10-CM | POA: Diagnosis not present

## 2022-10-16 DIAGNOSIS — I509 Heart failure, unspecified: Secondary | ICD-10-CM | POA: Diagnosis not present

## 2022-10-31 DIAGNOSIS — Z7902 Long term (current) use of antithrombotics/antiplatelets: Secondary | ICD-10-CM | POA: Diagnosis not present

## 2022-10-31 DIAGNOSIS — Z79899 Other long term (current) drug therapy: Secondary | ICD-10-CM | POA: Diagnosis not present

## 2022-10-31 DIAGNOSIS — Z8673 Personal history of transient ischemic attack (TIA), and cerebral infarction without residual deficits: Secondary | ICD-10-CM | POA: Diagnosis not present

## 2022-10-31 DIAGNOSIS — I252 Old myocardial infarction: Secondary | ICD-10-CM | POA: Diagnosis not present

## 2022-10-31 DIAGNOSIS — R9409 Abnormal results of other function studies of central nervous system: Secondary | ICD-10-CM | POA: Diagnosis not present

## 2022-10-31 DIAGNOSIS — Z888 Allergy status to other drugs, medicaments and biological substances status: Secondary | ICD-10-CM | POA: Diagnosis not present

## 2022-10-31 DIAGNOSIS — I509 Heart failure, unspecified: Secondary | ICD-10-CM | POA: Diagnosis not present

## 2022-10-31 DIAGNOSIS — I513 Intracardiac thrombosis, not elsewhere classified: Secondary | ICD-10-CM | POA: Diagnosis not present

## 2022-10-31 DIAGNOSIS — I501 Left ventricular failure: Secondary | ICD-10-CM | POA: Diagnosis not present

## 2022-10-31 DIAGNOSIS — I519 Heart disease, unspecified: Secondary | ICD-10-CM | POA: Diagnosis not present

## 2022-11-20 DIAGNOSIS — I639 Cerebral infarction, unspecified: Secondary | ICD-10-CM | POA: Diagnosis not present

## 2022-11-24 DIAGNOSIS — I5189 Other ill-defined heart diseases: Secondary | ICD-10-CM | POA: Diagnosis not present

## 2022-12-05 DIAGNOSIS — I5189 Other ill-defined heart diseases: Secondary | ICD-10-CM | POA: Diagnosis not present

## 2022-12-15 DIAGNOSIS — I5189 Other ill-defined heart diseases: Secondary | ICD-10-CM | POA: Diagnosis not present

## 2023-04-16 DIAGNOSIS — I509 Heart failure, unspecified: Secondary | ICD-10-CM | POA: Diagnosis not present

## 2023-04-16 DIAGNOSIS — I501 Left ventricular failure: Secondary | ICD-10-CM | POA: Diagnosis not present

## 2023-04-16 DIAGNOSIS — I2510 Atherosclerotic heart disease of native coronary artery without angina pectoris: Secondary | ICD-10-CM | POA: Diagnosis not present

## 2023-04-16 DIAGNOSIS — I252 Old myocardial infarction: Secondary | ICD-10-CM | POA: Diagnosis not present

## 2023-04-16 DIAGNOSIS — R9409 Abnormal results of other function studies of central nervous system: Secondary | ICD-10-CM | POA: Diagnosis not present

## 2023-08-03 DIAGNOSIS — I5189 Other ill-defined heart diseases: Secondary | ICD-10-CM | POA: Diagnosis not present

## 2023-08-10 DIAGNOSIS — I5189 Other ill-defined heart diseases: Secondary | ICD-10-CM | POA: Diagnosis not present
